# Patient Record
Sex: Female | Born: 1940 | Race: White | Hispanic: No
Health system: Southern US, Community
[De-identification: ages and names within clinical notes are randomized; demographics above are authoritative.]

---

## 2003-01-02 ENCOUNTER — Ambulatory Visit (HOSPITAL_COMMUNITY): Admission: RE | Admit: 2003-01-02 | Discharge: 2003-01-02 | Payer: Self-pay | Admitting: *Deleted

## 2003-01-06 ENCOUNTER — Ambulatory Visit (HOSPITAL_COMMUNITY): Admission: RE | Admit: 2003-01-06 | Discharge: 2003-01-06 | Payer: Self-pay | Admitting: *Deleted

## 2003-01-12 ENCOUNTER — Encounter: Payer: Self-pay | Admitting: Endocrinology

## 2009-04-20 ENCOUNTER — Encounter: Payer: Self-pay | Admitting: Endocrinology

## 2009-06-05 ENCOUNTER — Encounter: Payer: Self-pay | Admitting: Endocrinology

## 2009-06-16 ENCOUNTER — Encounter: Payer: Self-pay | Admitting: Endocrinology

## 2009-07-14 ENCOUNTER — Encounter: Payer: Self-pay | Admitting: Endocrinology

## 2009-08-17 ENCOUNTER — Encounter: Payer: Self-pay | Admitting: Endocrinology

## 2009-08-20 ENCOUNTER — Encounter: Payer: Self-pay | Admitting: Endocrinology

## 2009-08-25 ENCOUNTER — Encounter: Payer: Self-pay | Admitting: Endocrinology

## 2009-09-09 ENCOUNTER — Encounter: Payer: Self-pay | Admitting: Endocrinology

## 2009-09-10 ENCOUNTER — Ambulatory Visit: Payer: Self-pay | Admitting: Endocrinology

## 2009-09-10 DIAGNOSIS — E079 Disorder of thyroid, unspecified: Secondary | ICD-10-CM | POA: Insufficient documentation

## 2009-09-10 DIAGNOSIS — E782 Mixed hyperlipidemia: Secondary | ICD-10-CM | POA: Insufficient documentation

## 2009-09-10 DIAGNOSIS — H409 Unspecified glaucoma: Secondary | ICD-10-CM | POA: Insufficient documentation

## 2009-09-10 DIAGNOSIS — K219 Gastro-esophageal reflux disease without esophagitis: Secondary | ICD-10-CM

## 2009-09-10 LAB — CONVERTED CEMR LAB
Free T4: 1.01 ng/dL (ref 0.60–1.60)
TSH: 0.99 microintl units/mL (ref 0.35–5.50)

## 2009-10-08 ENCOUNTER — Ambulatory Visit: Payer: Self-pay | Admitting: Endocrinology

## 2009-10-08 DIAGNOSIS — D352 Benign neoplasm of pituitary gland: Secondary | ICD-10-CM | POA: Insufficient documentation

## 2009-10-08 DIAGNOSIS — D353 Benign neoplasm of craniopharyngeal duct: Secondary | ICD-10-CM

## 2009-10-08 LAB — CONVERTED CEMR LAB
BUN: 17 mg/dL (ref 6–23)
CO2: 28 meq/L (ref 19–32)
Calcium: 9.4 mg/dL (ref 8.4–10.5)
Chloride: 107 meq/L (ref 96–112)
Creatinine, Ser: 1.2 mg/dL (ref 0.4–1.2)
Free T4: 0.95 ng/dL (ref 0.60–1.60)
GFR calc non Af Amer: 48.92 mL/min (ref >60)
Glucose, Bld: 82 mg/dL (ref 70–99)
Potassium: 4.2 meq/L (ref 3.5–5.1)
Prolactin: 4.2 ng/mL
Sodium: 141 meq/L (ref 135–145)
TSH: 1.16 microintl units/mL (ref 0.35–5.50)

## 2010-02-28 LAB — CONVERTED CEMR LAB
Basophils Relative: 0 %
Eosinophils Relative: 2 %
Free T4: 9.7 ng/dL
HCT: 40.1 %
Hemoglobin: 13.8 g/dL
Lymphocytes, automated: 23 %
MCV: 94 fL
Monocytes Relative: 11 %
Neutrophils Relative %: 64 %
RBC: 4.29 M/uL
RDW: 13.2 %
T3, Free: 108 pg/mL
TSH: 0.05 microintl units/mL
WBC: 7.3 10*3/uL

## 2010-03-02 NOTE — Assessment & Plan Note (Signed)
Summary: NEW ENDO HYPOTHYROID PITUITARY PROBLEM MEDICARE PT-PER SHERI/...   Vital Signs:  Patient profile:   70 year old female Height:      65 inches (165.10 cm) Weight:      177 pounds (80.45 kg) BMI:     29.56 O2 Sat:      96 % on Room air Temp:     97.7 degrees F (36.50 degrees C) oral Pulse rate:   75 / minute Pulse rhythm:   regular BP sitting:   120 / 82  (left arm) Cuff size:   regular  Vitals Entered By: Brenton Grills MA (September 10, 2009 11:10 AM)  O2 Flow:  Room air CC: New Endo/Hypothyroid/Dr. Bech/aj   CC:  New Endo/Hypothyroid/Dr. Bech/aj.  History of Present Illness: pt says she had overactive thyroid approx 10 years ago.  she took medication for a while, but she was able to reduce it to the point where she no longer needed it.  she now has few mos of moderate palpitations in the chest, and associated doe.  she says sxs may be somewhat better over the past few weeks.   she was also noted by neurosurgeon in [place] to have a pituitary tumor.  Current Medications (verified): 1)  Fenofibrate 160 Mg Tabs (Fenofibrate) .Marland Kitchen.. 1 Tablet By Mouth Once Daily 2)  Famotidine 20 Mg Tabs (Famotidine) .Marland Kitchen.. 1 Tablet Two Times A Day 3)  Bayer Low Strength 81 Mg Tbec (Aspirin) .Marland Kitchen.. 1 By Mouth Once Daily 4)  Senna Laxative 25 Mg Tabs (Sennosides) .... 3-4 Pills Every 3-4 Days 5)  Vivelle-Dot 0.1 Mg/24hr Pttw (Estradiol) .Marland Kitchen.. 1 Patch Once Weekly 6)  Xalatan 0.005 % Soln (Latanoprost) .Marland Kitchen.. 1 Drop in Each Eye Once Daily 7)  Fiber  Powd (Fiber) .Marland Kitchen.. 1 Tbsp Daily  Allergies (verified): 1)  ! Codeine  Past History:  Past Medical History: GLAUCOMA (ICD-365.9) MIXED HYPERLIPIDEMIA (ICD-272.2) GERD (ICD-530.81) UNSPECIFIED DISORDER OF THYROID (ICD-246.9)  Family History: Reviewed history and no changes required. Family History of Arthritis Family History Hypertension Family History Lung cancer Family History High cholesterol Family History of Heart Disease no thyroid  dz  Social History: Reviewed history and no changes required. 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         she reports fatigue, tremor, menopausal sxs, rhinorrhea, and excessive diaphoresis.  right galactorrhea has resolved.  urinary hesitancy has resolved. denies weight loss, hoarseness, double vision, diarrhea, myalgias, numbness, anxiety, and easy bruising.  Physical Exam  General:  normal appearance.   Head:  head: no deformity eyes: no periorbital swelling, no proptosis external nose and ears are normal mouth: no lesion seen Neck:  i do not appreciate a goiter.   Lungs:  Clear to auscultation bilaterally. Normal respiratory effort.  Heart:  Regular rate and rhythm without murmurs or gallops noted. Normal S1,S2.   Msk:  muscle bulk and strength are grossly normal.  no obvious joint swelling.  gait is normal and steady  Extremities:  no deformity no edema Neurologic:  cn 2-12 grossly intact.   readily moves all 4's.   sensation is intact to touch on the feet  Skin:  normal texture and temp.  no rash.  not diaphoretic  Cervical Nodes:  No significant adenopathy.  Psych:  Alert and cooperative; normal mood and affect; normal attention span and concentration.   Additional Exam:  outside test results are reviewed:  tsh: 04/20/09:  1.2 08/17/09:  0.14 08/20/09:  0.05  today: FastTSH                   0.99 uIU/mL                 0.35-5.50 Free T4                   1.01 ng/dL       Impression & Recommendations:  Problem # 1:  UNSPECIFIED DISORDER OF THYROID (ICD-246.9) apparently had a episode of hyperthyroidism, of uncertain etiology, which has resolved.  Problem # 2:  pituitary tumor uncertain type  Problem # 3:  galactorrhea uncertain how of if related to #2  Medications Added to Medication List This Visit: 1)  Fenofibrate 160 Mg Tabs (Fenofibrate)  .Marland Kitchen.. 1 tablet by mouth once daily 2)  Famotidine 20 Mg Tabs (Famotidine) .Marland Kitchen.. 1 tablet two times a day 3)  Bayer Low Strength 81 Mg Tbec (Aspirin) .Marland Kitchen.. 1 by mouth once daily 4)  Senna Laxative 25 Mg Tabs (Sennosides) .... 3-4 pills every 3-4 days 5)  Vivelle-dot 0.1 Mg/24hr Pttw (Estradiol) .Marland Kitchen.. 1 patch once weekly 6)  Xalatan 0.005 % Soln (Latanoprost) .Marland Kitchen.. 1 drop in each eye once daily 7)  Fiber Powd (Fiber) .Marland Kitchen.. 1 tbsp daily  Other Orders: TLB-TSH (Thyroid Stimulating Hormone) (84443-TSH) TLB-T4 (Thyrox), Free [phone]) New Patient Level IV (18841)  Patient Instructions: 1)  at your next appointment here, please bring the name of the imaging center where the pituitary nodule was noted. 2)  blood tests are being ordered for you today.  please call [phone] to hear your test results. 3)  if the results are abnormal, i will request a thyroid ultrasound and nuclear medicaine scan. 4)  (update: i left message on phone-tree:  results are normal.  no testing needed now.  ret 1 month.)

## 2010-03-02 NOTE — Assessment & Plan Note (Signed)
Summary: 1 month f/u #/cd   Vital Signs:  Patient profile:   70 year old female Height:      65 inches (165.10 cm) Weight:      175.50 pounds (79.77 kg) BMI:     29.31 O2 Sat:      98 % on Room air Temp:     97.7 degrees F (36.50 degrees C) oral Pulse rate:   73 / minute BP sitting:   110 / 70  (left arm) Cuff size:   regular  Vitals Entered By: Brenton Grills MA (October 08, 2009 9:53 AM)  O2 Flow:  Room air CC: 1 month F/U/aj   Referring Provider:  Susa Loffler MD Primary Provider:  Susa Loffler MD  CC:  1 month F/U/aj.  History of Present Illness: the status of at least 3 ongoing medical problems is addressed today: pt has h/o transient hyperthyroidism.  it was normal upon recheck here, last month.  pt states she feels well, except for occasional palpitations she has slight right-sided galactorrhea, and fatigue.  she says the galactorrhea does not bother her.   thyroid disorder:  she has fatigue  Current Medications (verified): 1)  Fenofibrate 160 Mg Tabs (Fenofibrate) .Marland Kitchen.. 1 Tablet By Mouth Once Daily 2)  Famotidine 20 Mg Tabs (Famotidine) .Marland Kitchen.. 1 Tablet Two Times A Day 3)  Bayer Low Strength 81 Mg Tbec (Aspirin) .Marland Kitchen.. 1 By Mouth Once Daily 4)  Senna Laxative 25 Mg Tabs (Sennosides) .... 3-4 Pills Every 3-4 Days 5)  Vivelle-Dot 0.1 Mg/24hr Pttw (Estradiol) .Marland Kitchen.. 1 Patch Once Weekly 6)  Xalatan 0.005 % Soln (Latanoprost) .Marland Kitchen.. 1 Drop in Each Eye Once Daily 7)  Fiber  Powd (Fiber) .Marland Kitchen.. 1 Tbsp Daily  Allergies (verified): 1)  ! Codeine  Past History:  Past Medical History: Last updated: 09/10/2009 GLAUCOMA (ICD-365.9) MIXED HYPERLIPIDEMIA (ICD-272.2) GERD (ICD-530.81) UNSPECIFIED DISORDER OF THYROID (ICD-246.9)  Review of Systems  The patient denies weight loss and weight gain.         she has a slight headache.    Physical Exam  Neck:  i do not appreciate a goiter.   Additional Exam:  FastTSH                   1.16 uIU/mL                 0.35-5.50   Free T4                    0.95 ng/dL                  0.60-1.60   Prolactin                 4.2 ng/ml   Sodium                    141 mEq/L                   135-145   Potassium                 4.2 mEq/L                   3.5-5.1   Chloride                  107 mEq/L                   96-112   Carbon Dioxide  28 mEq/L                    19-32   Glucose                   82 mg/dL                    19-14   BUN                       17 mg/dL                    7-82   Creatinine                1.2 mg/dL                   9.5-6.2   Calcium                   9.4 mg/dL                   1.3-08.6   GFR                       48.92 mL/min   Impression & Recommendations:  Problem # 1:  UNSPECIFIED DISORDER OF THYROID (ICD-246.9) resolved uncertain etiology  Problem # 2:  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 Patient Level IV (52841)  Patient Instructions: 1)  blood tests are being ordered for you today.  please call [phone] to hear your test results. 2)  please sign release of information from mri at [facility]. 3)  if thyroid is normal, you can drop back to have those blood tests once a year.   4)  (update: i left message on phone-tree:  rx as we discussed)

## 2010-03-02 NOTE — Letter (Signed)
Summary: [facility]  [facility]   Imported By: Sherian Rein 09/14/2009 09:53:25  _____________________________________________________________________  External Attachment:    Type:   Image     Comment:   External Document

## 2017-08-31 ENCOUNTER — Other Ambulatory Visit: Payer: Self-pay | Admitting: Orthopedic Surgery

## 2017-08-31 DIAGNOSIS — M4326 Fusion of spine, lumbar region: Secondary | ICD-10-CM

## 2017-09-13 ENCOUNTER — Ambulatory Visit
Admission: RE | Admit: 2017-09-13 | Discharge: 2017-09-13 | Disposition: A | Discharge status: Home / Self Care | Payer: Medicare Other | Source: Ambulatory Visit | Attending: Orthopedic Surgery | Admitting: Orthopedic Surgery

## 2017-09-13 DIAGNOSIS — M4326 Fusion of spine, lumbar region: Secondary | ICD-10-CM

## 2017-09-13 MED ORDER — ONDANSETRON HCL 4 MG/2ML IJ SOLN
4.0000 mg | Freq: Once | INTRAMUSCULAR | Status: AC
  Administered 2017-09-13: 4 mg via INTRAMUSCULAR

## 2017-09-13 MED ORDER — IOPAMIDOL (ISOVUE-M 200) INJECTION 41%
15.0000 mL | Freq: Once | INTRAMUSCULAR | Status: AC
  Administered 2017-09-13: 15 mL via INTRATHECAL

## 2017-09-13 MED ORDER — DIAZEPAM 5 MG PO TABS: 5.0000 mg | ORAL_TABLET | Freq: Once | ORAL | Status: DC

## 2017-09-13 MED ORDER — MEPERIDINE HCL 100 MG/ML IJ SOLN
75.0000 mg | Freq: Once | INTRAMUSCULAR | Status: AC
  Administered 2017-09-13: 75 mg via INTRAMUSCULAR

## 2017-09-13 NOTE — Discharge Instructions (Signed)

## 2019-08-29 IMAGING — XA DG MYELOGRAPHY LUMBAR INJ LUMBOSACRAL
13 of 17 series · 13 of 17 positions shown · non-contrast
Comparison: MRI lumbar spine dated October 13, 2016.

CLINICAL DATA: Lumbosacral spondylosis without myelopathy. Prior
L3-S1 fusion in January 2017. Worsening left-sided low back pain
radiating into the left leg since surgery.
TECHNIQUE: Contiguous axial images were obtained through the lumbar spine after
the intrathecal infusion of contrast. Coronal and sagittal
reconstructions were obtained of the axial image sets.

[Series 1: vasc standard · 1 of 1 slices shown (1 of 10)]
[im 1/1]
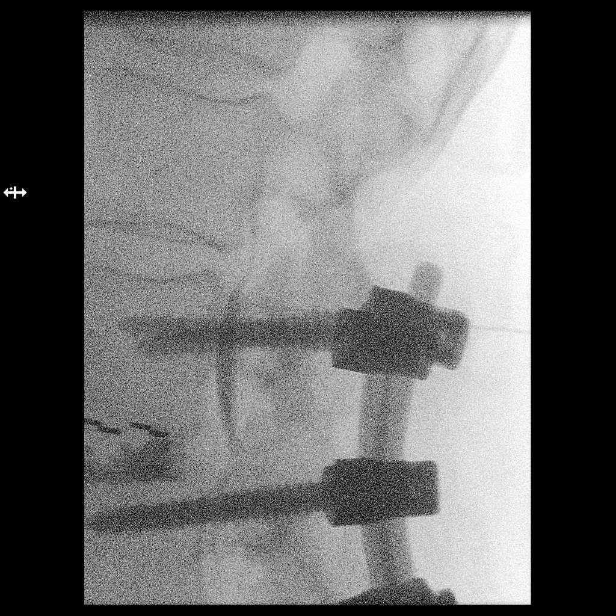

[Series 1: w lumbar spine lat · 0.15mm/px · 1 of 1 slices shown]
[im 1/1]
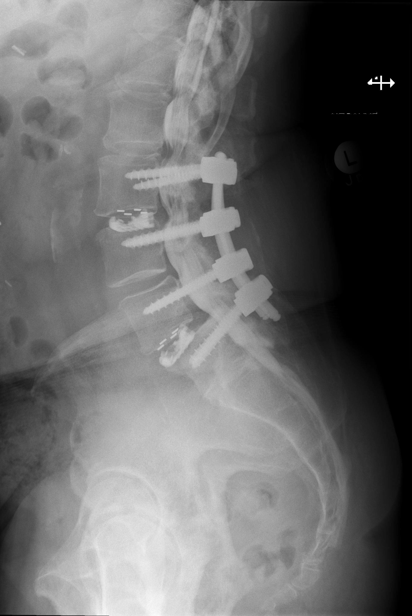

[Series 2: w lumbar spine flexion · 0.15mm/px · 1 of 1 slices shown]
[im 1/1]
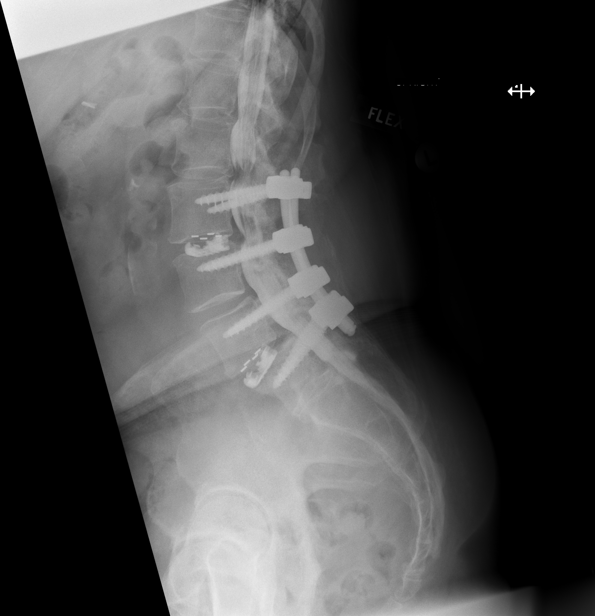

[Series 3: vasc standard · 1 of 1 slices shown (2 of 10)]
[im 1/1]
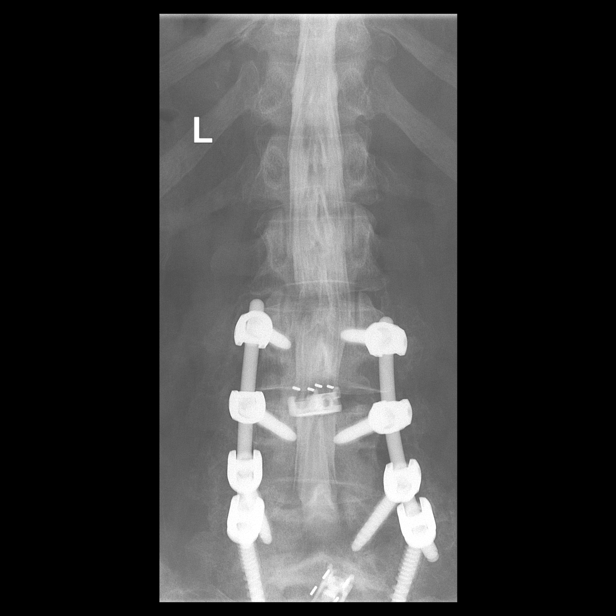

[Series 3: w lumbar spine extension · 0.15mm/px · 1 of 1 slices shown]
[im 1/1]
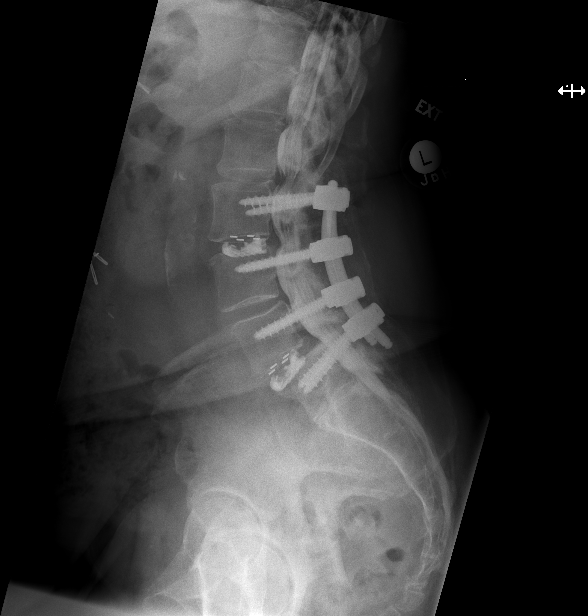

[Series 5: vasc standard · 1 of 1 slices shown (3 of 10)]
[im 1/1]
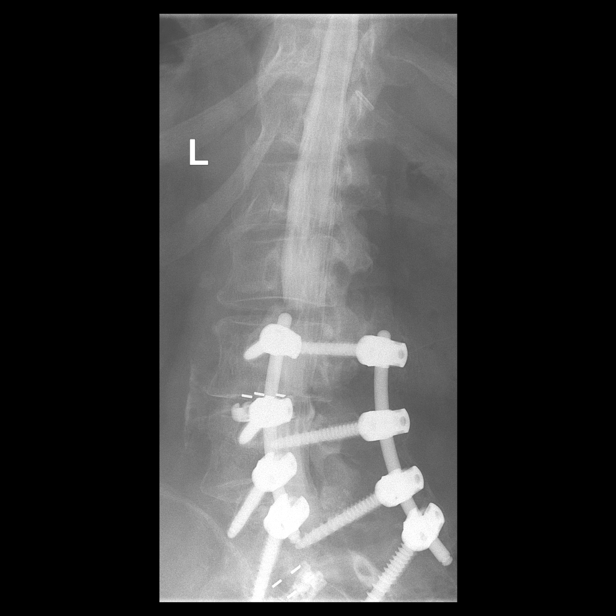

[Series 6: vasc standard · 1 of 1 slices shown (4 of 10)]
[im 1/1]
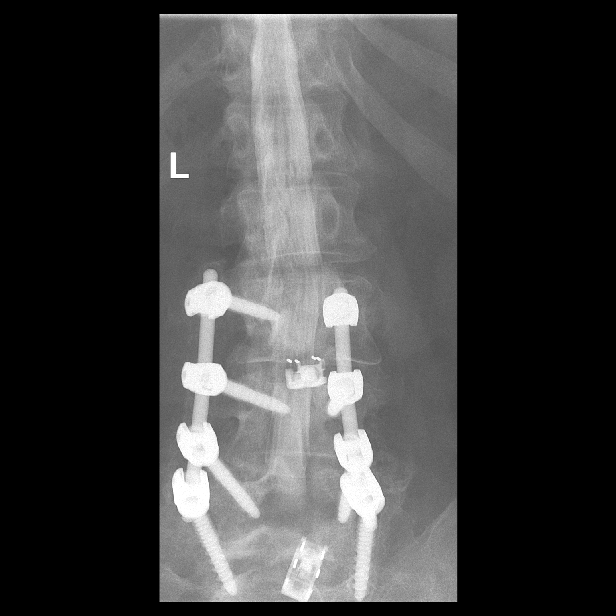

[Series 7: vasc standard · 1 of 1 slices shown (5 of 10)]
[im 1/1]
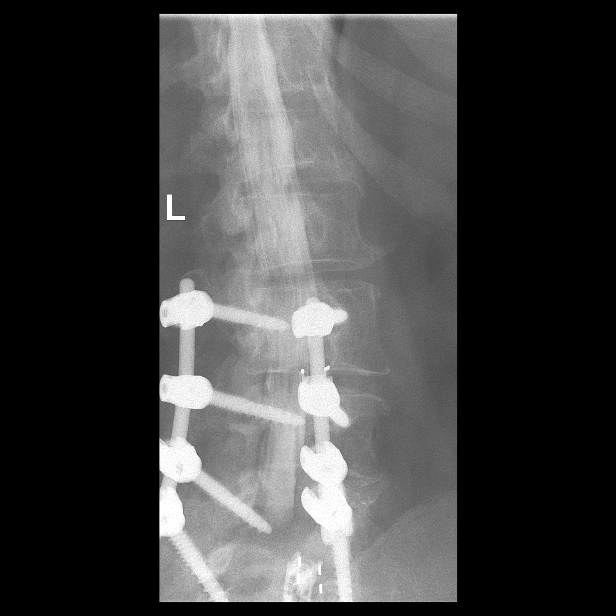

[Series 9: vasc standard · 1 of 1 slices shown (6 of 10)]
[im 1/1]
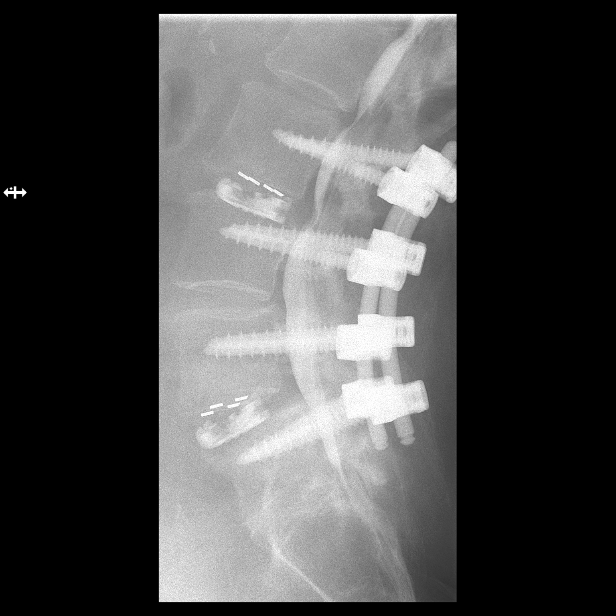

[Series 10: vasc standard · 1 of 1 slices shown (7 of 10)]
[im 1/1]
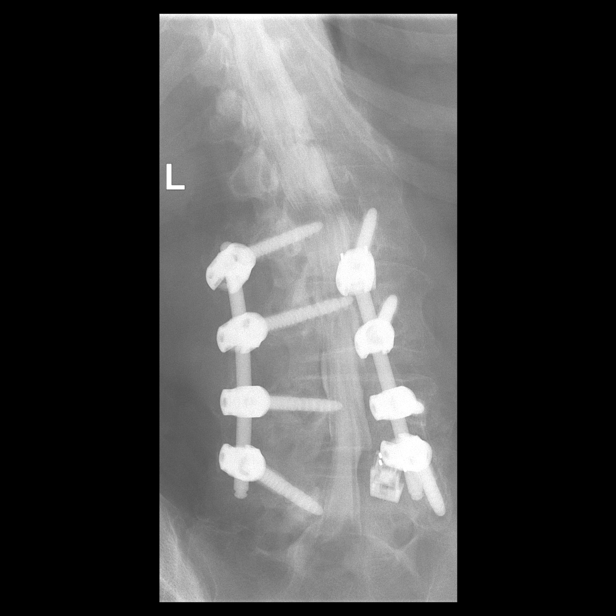

[Series 11: vasc standard · 1 of 1 slices shown (8 of 10)]
[im 1/1]
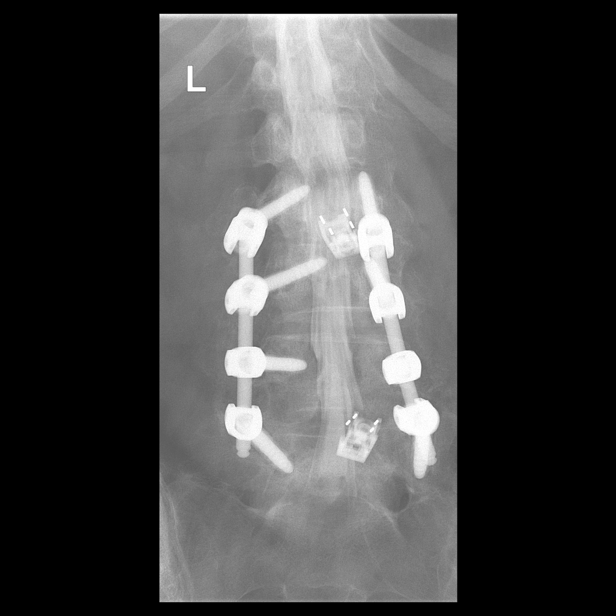

[Series 13: vasc standard · 1 of 1 slices shown (9 of 10)]
[im 1/1]
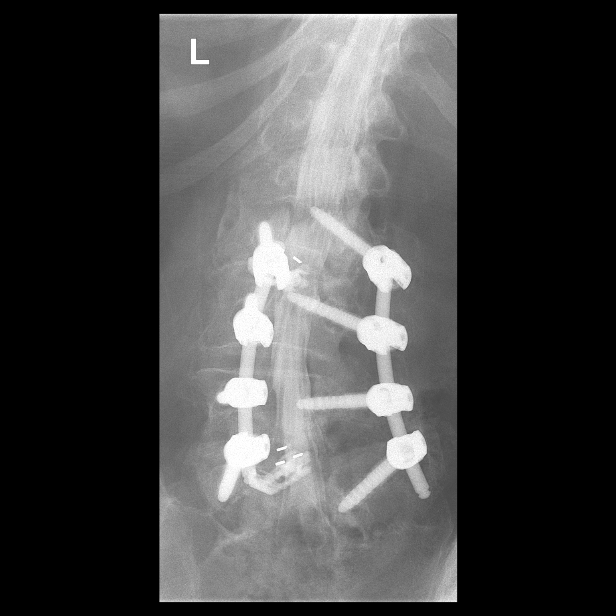

[Series 14: vasc standard · 1 of 1 slices shown (10 of 10)]
[im 1/1]
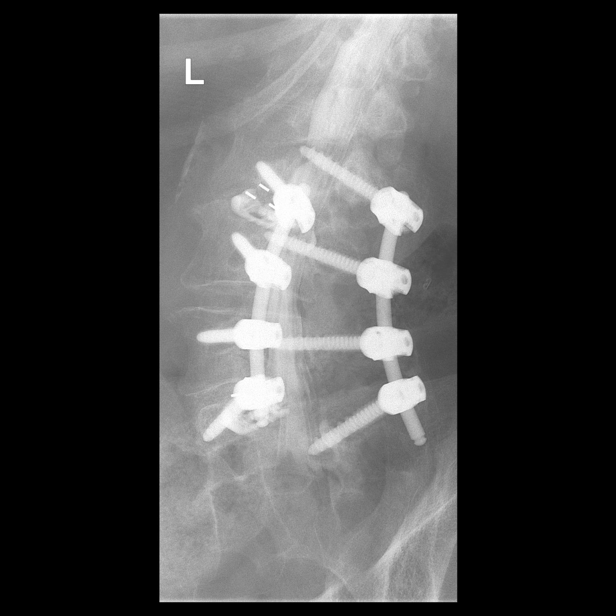

[13 of 17 positions shown; findings below may reference images not displayed]

EXAM:
LUMBAR MYELOGRAM

CT LUMBAR MYELOGRAM

FLUOROSCOPY TIME:  Radiation Exposure Index (as provided by the
fluoroscopic device): 211.77 Gy*m2

Fluoroscopy Time:  11 seconds

Number of Acquired Images:  15

PROCEDURE:
After thorough discussion of risks and benefits of the procedure
including bleeding, infection, injury to nerves, blood vessels,
adjacent structures as well as headache and CSF leak, written and
oral informed consent was obtained. Consent was obtained by Dr.
Shio Canelo. Time out form was completed.

Patient was positioned prone on the fluoroscopy table. Local
anesthesia was provided with 1% lidocaine without epinephrine after
prepped and draped in the usual sterile fashion. Puncture was
performed at L2-L3 using a 3 1/2 inch 22-gauge spinal needle via
left paramedian approach. Using a single pass through the dura, the
needle was placed within the thecal sac, with return of clear CSF.
15 mL of Isovue G-III was injected into the thecal sac, with normal
opacification of the nerve roots and cauda equina consistent with
free flow within the subarachnoid space.

I personally performed the lumbar puncture and administered the
intrathecal contrast. I also personally supervised acquisition of
the myelogram images.
FINDINGS: LUMBAR MYELOGRAM FINDINGS:

Unchanged transitional lumbosacral anatomy with lumbarization of S1.
Prior L3-S1 PLIF. Interbody spacers at L3-L4 and L5-S1. No acute
fracture or subluxation. Vertebral body heights are preserved. 3 mm
anterolisthesis at L4-L5. 8 mm anterolisthesis at L5-S1. No dynamic
instability. Small ventral extradural defect at L1-L2. Moderate
ventral extradural defect at L2-L3 and L3-L4. These are not
significantly changed with standing.

CT LUMBAR MYELOGRAM FINDINGS:

Segmentation: Lumbarization of S1.

Alignment: 3 mm anterolisthesis at L4-L5. 7 mm anterolisthesis at
L5-S1.

Vertebrae: Prior L3-S1 PLIF with interbody spacers at L3-L4 and
L5-S1. No convincing interbody fusion at L5-S1. Minimal lucency
surrounding the bilateral S1 pedicle screws. No fracture or other
focal pathologic process.

Conus medullaris and cauda equina: Conus extends to the L2 level.
Conus and cauda equina appear normal.

Paraspinal and other soft tissues: Aortic atherosclerosis. Mild
bilateral sacroiliac joint osteoarthritis.

Disc levels:

T12-L1: Tiny calcified left paracentral disc protrusion. No
stenosis.

L1-L2: Small diffuse disc bulge eccentric to the right. No stenosis.

L2-L3: Small diffuse disc bulge with new left paracentral disc
protrusion posteriorly displacing the descending left L3 nerve root.
Mild bilateral facet arthropathy. Mild to moderate central spinal
canal stenosis and mild left neuroforaminal stenosis. No right
neuroforaminal stenosis.

L3-L4: Prior PLIF. Residual central posterior bulging disc/graft
material. Mild residual spinal canal and bilateral lateral recess
stenosis, improved when compared to prior MRI. No neuroforaminal
stenosis.

L4-L5: Prior posterior decompression and fusion. Fusion of the
posterior elements. No stenosis.

L5-S1: Prior PLIF. Fusion of the posterior elements. Disc
uncovering. New mild right neuroforaminal stenosis due to right
foraminal disc osteophyte complex. No spinal canal or left
neuroforaminal stenosis.
IMPRESSION: 1. Worsening degenerative disc disease at L2-L3 with increased disc
bulging and new left paracentral disc protrusion resulting in
mild-to-moderate central spinal canal stenosis with posterior
displacement of the descending left L3 nerve root.
2. Prior L3-S1 PLIF with minimal lucency surrounding the bilateral
S1 screws and no convincing L5-S1 interbody fusion yet.
3. Mild residual spinal canal and bilateral lateral recess stenosis
at L3-L4, significantly improved post PLIF.
4. New right foraminal disc osteophyte complex at L5-S1 resulting in
mild right neuroforaminal stenosis.
5. Trace L4-L5 and grade 1 L5-S1 anterolisthesis. No dynamic
instability.
6.  Aortic atherosclerosis ([id number]).
